# Patient Record
Sex: Female | Born: 1950 | Race: Black or African American | Hispanic: No | Marital: Married | Smoking: Former smoker
Health system: Southern US, Community
[De-identification: ages and names within clinical notes are randomized; demographics above are authoritative.]

## PROBLEM LIST (undated history)

## (undated) DIAGNOSIS — I509 Heart failure, unspecified: Secondary | ICD-10-CM

## (undated) DIAGNOSIS — I10 Essential (primary) hypertension: Secondary | ICD-10-CM

## (undated) DIAGNOSIS — J449 Chronic obstructive pulmonary disease, unspecified: Secondary | ICD-10-CM

## (undated) HISTORY — PX: ABDOMINAL HYSTERECTOMY: SHX81

---

## 1999-05-05 ENCOUNTER — Other Ambulatory Visit: Admission: RE | Admit: 1999-05-05 | Discharge: 1999-05-05 | Payer: Self-pay | Admitting: Family Medicine

## 2002-11-11 ENCOUNTER — Encounter: Admission: RE | Admit: 2002-11-11 | Discharge: 2002-11-11 | Payer: Self-pay | Admitting: Family Medicine

## 2002-11-11 ENCOUNTER — Encounter: Payer: Self-pay | Admitting: Family Medicine

## 2004-01-07 ENCOUNTER — Encounter: Admission: RE | Admit: 2004-01-07 | Discharge: 2004-01-07 | Payer: Self-pay | Admitting: Family Medicine

## 2017-10-11 ENCOUNTER — Emergency Department (HOSPITAL_BASED_OUTPATIENT_CLINIC_OR_DEPARTMENT_OTHER)
Admission: EM | Admit: 2017-10-11 | Discharge: 2017-10-11 | Disposition: A | Discharge status: Home / Self Care | Payer: Medicare HMO | Attending: Emergency Medicine | Admitting: Emergency Medicine

## 2017-10-11 ENCOUNTER — Emergency Department (HOSPITAL_BASED_OUTPATIENT_CLINIC_OR_DEPARTMENT_OTHER): Payer: Medicare HMO

## 2017-10-11 ENCOUNTER — Encounter (HOSPITAL_BASED_OUTPATIENT_CLINIC_OR_DEPARTMENT_OTHER): Payer: Self-pay | Admitting: *Deleted

## 2017-10-11 DIAGNOSIS — J441 Chronic obstructive pulmonary disease with (acute) exacerbation: Secondary | ICD-10-CM

## 2017-10-11 DIAGNOSIS — Z7982 Long term (current) use of aspirin: Secondary | ICD-10-CM | POA: Diagnosis not present

## 2017-10-11 DIAGNOSIS — R0602 Shortness of breath: Secondary | ICD-10-CM | POA: Diagnosis present

## 2017-10-11 DIAGNOSIS — Z79899 Other long term (current) drug therapy: Secondary | ICD-10-CM | POA: Insufficient documentation

## 2017-10-11 DIAGNOSIS — I509 Heart failure, unspecified: Secondary | ICD-10-CM | POA: Diagnosis not present

## 2017-10-11 DIAGNOSIS — R05 Cough: Secondary | ICD-10-CM | POA: Diagnosis not present

## 2017-10-11 DIAGNOSIS — R0981 Nasal congestion: Secondary | ICD-10-CM | POA: Diagnosis not present

## 2017-10-11 DIAGNOSIS — I110 Hypertensive heart disease with heart failure: Secondary | ICD-10-CM | POA: Diagnosis not present

## 2017-10-11 DIAGNOSIS — R062 Wheezing: Secondary | ICD-10-CM | POA: Diagnosis not present

## 2017-10-11 DIAGNOSIS — Z87891 Personal history of nicotine dependence: Secondary | ICD-10-CM | POA: Insufficient documentation

## 2017-10-11 HISTORY — DX: Chronic obstructive pulmonary disease, unspecified: J44.9

## 2017-10-11 HISTORY — DX: Essential (primary) hypertension: I10

## 2017-10-11 HISTORY — DX: Heart failure, unspecified: I50.9

## 2017-10-11 MED ORDER — PREDNISONE 50 MG PO TABS: 60.0000 mg | ORAL_TABLET | Freq: Once | ORAL | Status: DC

## 2017-10-11 MED ORDER — IPRATROPIUM-ALBUTEROL 0.5-2.5 (3) MG/3ML IN SOLN
3.0000 mL | Freq: Once | RESPIRATORY_TRACT | Status: AC
  Administered 2017-10-11: 3 mL via RESPIRATORY_TRACT

## 2017-10-11 MED ORDER — IPRATROPIUM-ALBUTEROL 0.5-2.5 (3) MG/3ML IN SOLN
3.0000 mL | Freq: Four times a day (QID) | RESPIRATORY_TRACT | Status: DC
Start: 2017-10-11 — End: 2017-10-12
  Administered 2017-10-11: 3 mL via RESPIRATORY_TRACT

## 2017-10-11 MED ORDER — PREDNISONE 20 MG PO TABS: 40.0000 mg | ORAL_TABLET | Freq: Every day | ORAL | 0 refills | Status: AC

## 2017-10-11 MED ORDER — ALBUTEROL SULFATE (2.5 MG/3ML) 0.083% IN NEBU
2.5000 mg | INHALATION_SOLUTION | Freq: Once | RESPIRATORY_TRACT | Status: AC
Start: 2017-10-11 — End: 2017-10-11
  Administered 2017-10-11: 2.5 mg via RESPIRATORY_TRACT

## 2017-10-11 MED ORDER — PREDNISONE 50 MG PO TABS
ORAL_TABLET | ORAL | Status: AC
  Administered 2017-10-11: 50 mg
  Filled 2017-10-11: qty 1

## 2017-10-11 MED ORDER — PREDNISONE 10 MG PO TABS
ORAL_TABLET | ORAL | Status: AC
  Administered 2017-10-11: 10 mg
  Filled 2017-10-11: qty 1

## 2017-10-11 NOTE — ED Triage Notes (Signed)
Pt c/o SOB  Wheezing , coughing  x 1 week HX copd

## 2017-10-11 NOTE — ED Provider Notes (Signed)
[facility] EMERGENCY DEPARTMENT Provider Note   CSN: [id number] Arrival date & time: 10/11/17  1745     History   Chief Complaint Chief Complaint  Patient presents with  . Shortness of Breath    HPI Terri Decker is a 67 y.o. female.  HPI  67 year old female with a history of CHF and COPD presents with shortness of breath and cough and wheezing.  Started about a week ago.  She is traveling here from [place] and will be going back around January 20.  This is typical of her COPD exacerbations.  She has been trying her albuterol with partial relief but then the symptoms return.  Cough is nonproductive and she has not had fevers.  Occasional chest pain but she states this is typical of her COPD.  She was given a breathing treatment prior to me seeing her and states she feels better.  Still has some wheezing.  Has had some nasal congestion.  Past Medical History:  Diagnosis Date  . CHF (congestive heart failure) (HCC)   . COPD (chronic obstructive pulmonary disease) (HCC)   . Hypertension     There are no active problems to display for this patient.   Past Surgical History:  Procedure Laterality Date  . ABDOMINAL HYSTERECTOMY      OB History    No data available       Home Medications    Prior to Admission medications   Medication Sig Start Date End Date Taking? Authorizing Provider  albuterol (PROVENTIL HFA;VENTOLIN HFA) 108 (90 Base) MCG/ACT inhaler Inhale 2 puffs into the lungs every 6 (six) hours as needed for wheezing or shortness of breath.   Yes [provider]  amLODipine (NORVASC) 10 MG tablet Take 10 mg by mouth daily.   Yes [provider]  aspirin 81 MG chewable tablet Chew by mouth daily.   Yes [provider]  budesonide-formoterol (SYMBICORT) 160-4.5 MCG/ACT inhaler Inhale 2 puffs into the lungs 2 (two) times daily.   Yes [provider]  calcium gluconate 500 MG tablet Take 1 tablet by mouth daily.   Yes  [provider]  cetirizine (ZYRTEC) 10 MG chewable tablet Chew 10 mg by mouth daily.   Yes [provider]  citalopram (CELEXA) 20 MG tablet Take 20 mg by mouth daily.   Yes [provider]  cloNIDine (CATAPRES) 0.1 MG tablet Take 0.1 mg by mouth 2 (two) times daily.   Yes [provider]  co-enzyme Q-10 30 MG capsule Take 100 mg by mouth daily.   Yes [provider]  furosemide (LASIX) 20 MG tablet Take 20 mg by mouth.   Yes [provider]  gabapentin (NEURONTIN) 600 MG tablet Take 600 mg by mouth 3 (three) times daily.   Yes [provider]  hydrOXYzine (ATARAX/VISTARIL) 25 MG tablet Take 25 mg by mouth every 6 (six) hours as needed.   Yes [provider]  meloxicam (MOBIC) 15 MG tablet Take 15 mg by mouth daily.   Yes [provider]  montelukast (SINGULAIR) 10 MG tablet Take 10 mg by mouth at bedtime.   Yes [provider]  pantoprazole (PROTONIX) 40 MG tablet Take 40 mg by mouth daily.   Yes [provider]  potassium chloride (K-DUR) 10 MEQ tablet Take 10 mEq by mouth daily.   Yes [provider]  tiotropium (SPIRIVA) 18 MCG inhalation capsule Place 18 mcg into inhaler and inhale daily.   Yes [provider]  tiZANidine (ZANAFLEX) 4 MG tablet Take 4 mg by mouth every 6 (six) hours as needed for muscle spasms.   Yes [provider]  valsartan-hydrochlorothiazide (DIOVAN-HCT) 320-25 MG tablet Take 1 tablet by mouth daily.   Yes [provider]  vitamin B-12 (CYANOCOBALAMIN) 250 MCG tablet Take 2,500 mcg by mouth daily.   Yes [provider]  predniSONE (DELTASONE) 20 MG tablet Take 2 tablets (40 mg total) by mouth daily. 2 tabs po daily x 4 days 10/12/17   Pricilla Loveless, MD    Family History No family history on file.  Social History Social History   Tobacco Use  . Smoking status: Former Smoker  Substance Use Topics  . Alcohol use: No     Frequency: Never  . Drug use: No     Allergies   Patient has no allergy information on record.   Review of Systems Review of Systems  Constitutional: Negative for fever.  Respiratory: Positive for cough, chest tightness and shortness of breath.   Cardiovascular: Positive for leg swelling. Palpitations: trace, ankles, bilateral.  All other systems reviewed and are negative.    Physical Exam Updated Vital Signs BP 138/78 (BP Location: Right Arm)   Pulse 83   Temp (!) 97.4 F (36.3 C) (Oral)   Resp 17   SpO2 98%   Physical Exam  Constitutional: She is oriented to person, place, and time. She appears well-developed and well-nourished.  Non-toxic appearance. She does not appear ill. No distress.  HENT:  Head: Normocephalic and atraumatic.  Right Ear: External ear normal.  Left Ear: External ear normal.  Nose: Nose normal.  Eyes: Right eye exhibits no discharge. Left eye exhibits no discharge.  Cardiovascular: Normal rate, regular rhythm and normal heart sounds.  Pulmonary/Chest: Effort normal. No accessory muscle usage. No tachypnea. No respiratory distress. She has wheezes (diffuse, expiratory).  Speaks in complete sentences without difficulty  Abdominal: Soft. There is no tenderness.  Musculoskeletal:       Right lower leg: She exhibits edema (trace, ankles).       Left lower leg: She exhibits edema (trace, ankles).  Neurological: She is alert and oriented to person, place, and time.  Skin: Skin is warm and dry.  Nursing note and vitals reviewed.    ED Treatments / Results  Labs (all labs ordered are listed, but only abnormal results are displayed) Labs Reviewed - No data to display  EKG  EKG Interpretation  Date/Time:  Wednesday October 11 2017 18:23:28 EST Ventricular Rate:  75 PR Interval:    QRS Duration: 99 QT Interval:  532 QTC Calculation: 595 R Axis:   63 Text Interpretation:  Sinus rhythm Borderline T abnormalities, anterior leads Prolonged QT  interval Baseline wander in lead(s) aVL No old tracing to compare Confirmed by Pricilla Loveless [phone]) on 10/11/2017 6:55:45 PM       Radiology Dg Chest 2 View  Result Date: 10/11/2017 CLINICAL DATA:  Wheezing and coughing. EXAM: CHEST  2 VIEW COMPARISON:  None. FINDINGS: Cardiomediastinal silhouette is normal. Mediastinal contours appear intact. Calcific atherosclerotic disease and tortuosity of the aorta. There is no evidence of focal airspace consolidation, pleural effusion or pneumothorax. Mild peribronchial thickening with central predominance. Osseous structures are without acute abnormality. Soft tissues are grossly normal. IMPRESSION: Mild peribronchial thickening with central predominance without evidence of lobar consolidation. Findings may be seen with acute bronchitis or reactive airway disease. Electronically Signed   By: Ted Mcalpine M.D.   On: 10/11/2017 19:46  Procedures Procedures (including critical care time)  Medications Ordered in ED Medications  ipratropium-albuterol (DUONEB) 0.5-2.5 (3) MG/3ML nebulizer solution 3 mL (3 mLs Nebulization Given 10/11/17 1800)  albuterol (PROVENTIL) (2.5 MG/3ML) 0.083% nebulizer solution 2.5 mg (2.5 mg Nebulization Given 10/11/17 1800)  predniSONE (DELTASONE) 50 MG tablet (50 mg  Given 10/11/17 1924)  predniSONE (DELTASONE) 10 MG tablet (10 mg  Given 10/11/17 1924)     Initial Impression / Assessment and Plan / ED Course  I have reviewed the triage vital signs and the nursing notes.  Pertinent labs & imaging results that were available during my care of the patient were reviewed by me and considered in my medical decision making (see chart for details).     Patient feels better after 2 albuterol treatments.  Presentation seems consistent with COPD exacerbation.  She does not have any signs of pulmonary edema on chest x-ray.  She does not have any productive sputum or fever to suggest pneumonia.  At this point, will treat with  prednisone and continued albuterol at home.  I do not think antibiotics are needed.  She is not hypoxic.  Discharge home with return precautions, follow-up with PCP when back in her home state.  Final Clinical Impressions(s) / ED Diagnoses   Final diagnoses:  COPD exacerbation [facility])    ED Discharge Orders        Ordered    predniSONE (DELTASONE) 20 MG tablet  Daily     10/11/17 2112       Pricilla Loveless, MD 10/12/17 [phone]

## 2017-10-11 NOTE — ED Notes (Signed)
RT in triage, pt taken to room 5

## 2017-10-11 NOTE — ED Notes (Signed)
Nurse first- O2 sat 98% HR 73 RR 24-NAD-states she has been wheezing x 3 days

## 2018-12-31 IMAGING — CR DG CHEST 2V
2 series · 2 of 2 positions shown · non-contrast
Comparison: None.

CLINICAL DATA: Wheezing and coughing.

EXAM:
CHEST  2 VIEW

[w chest pa]
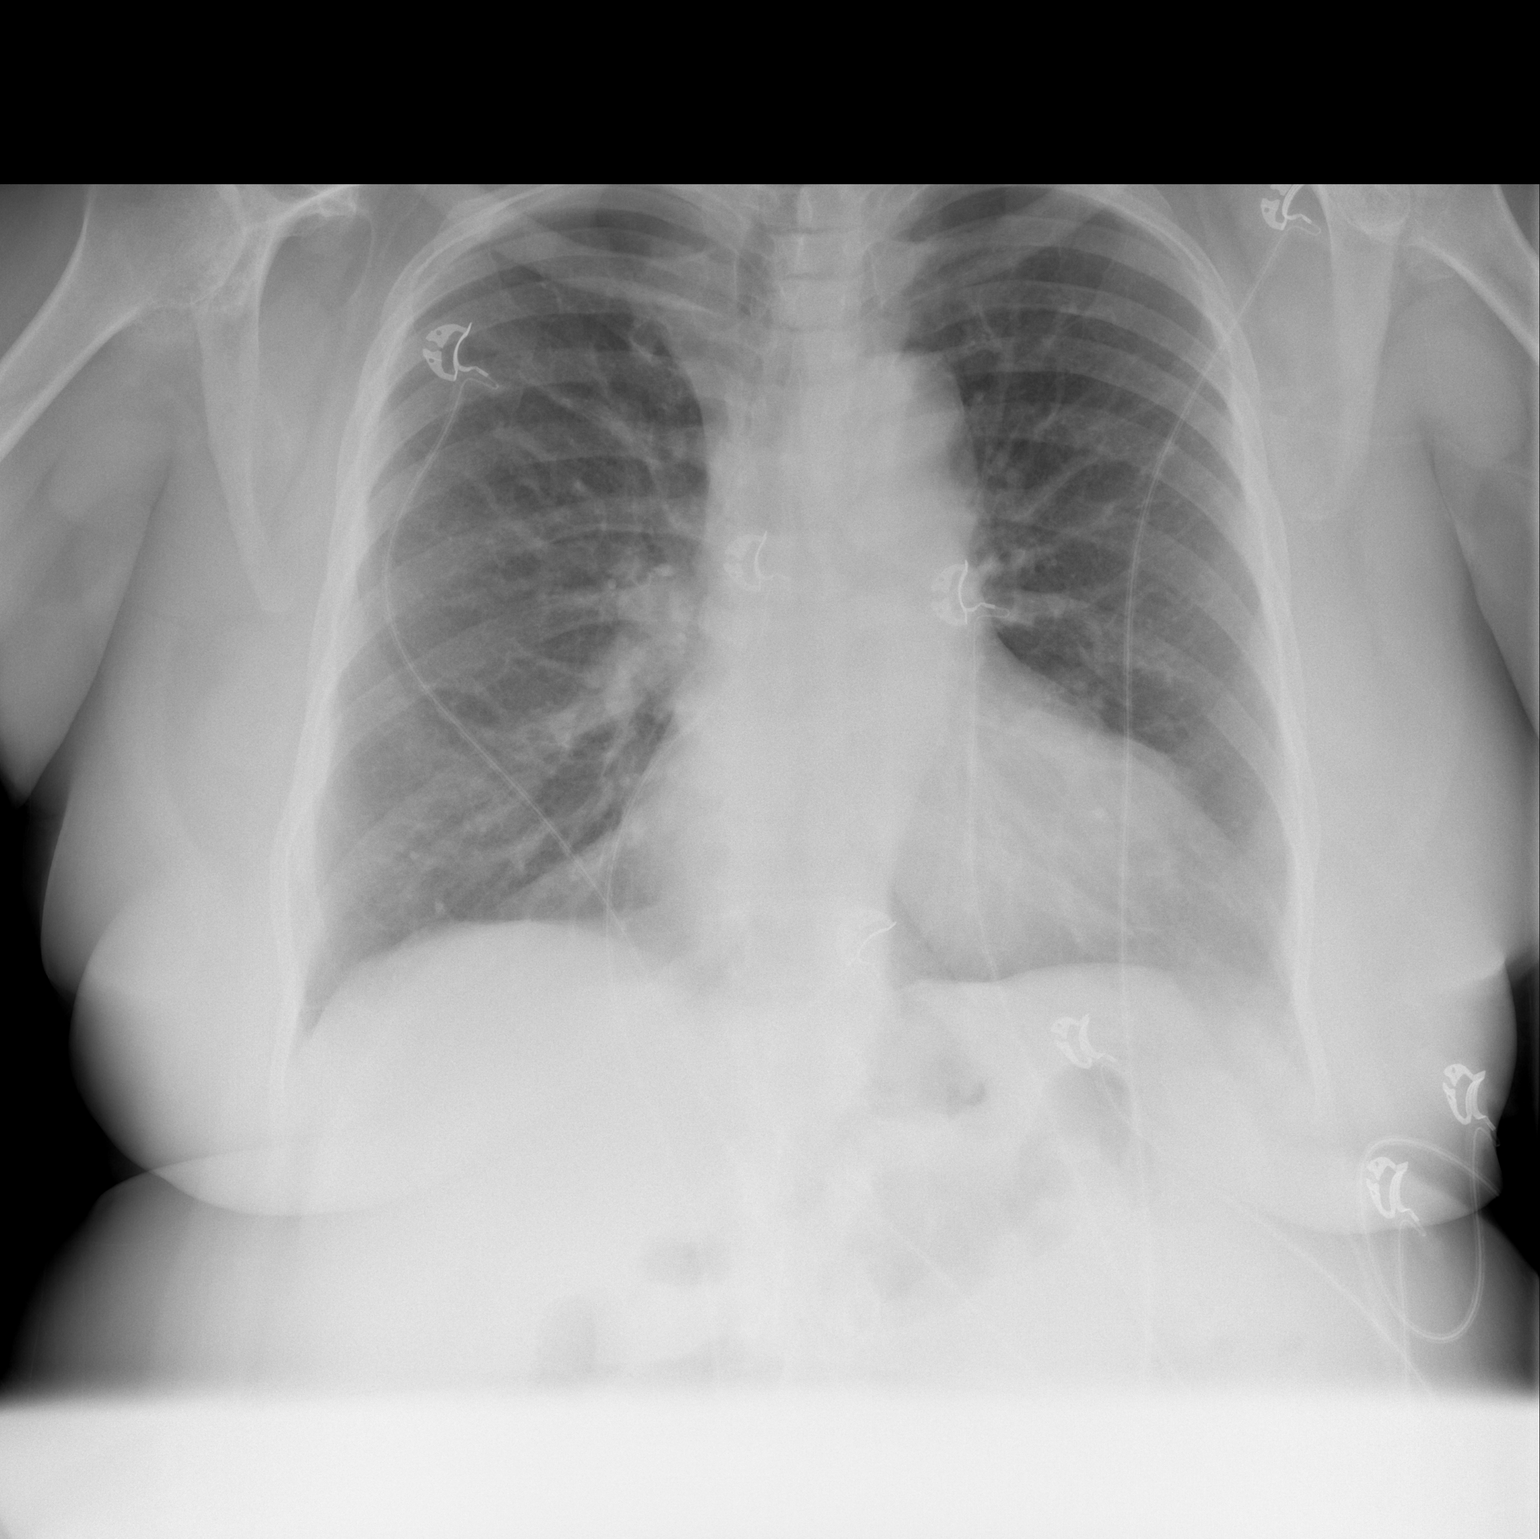

[w chest lat]
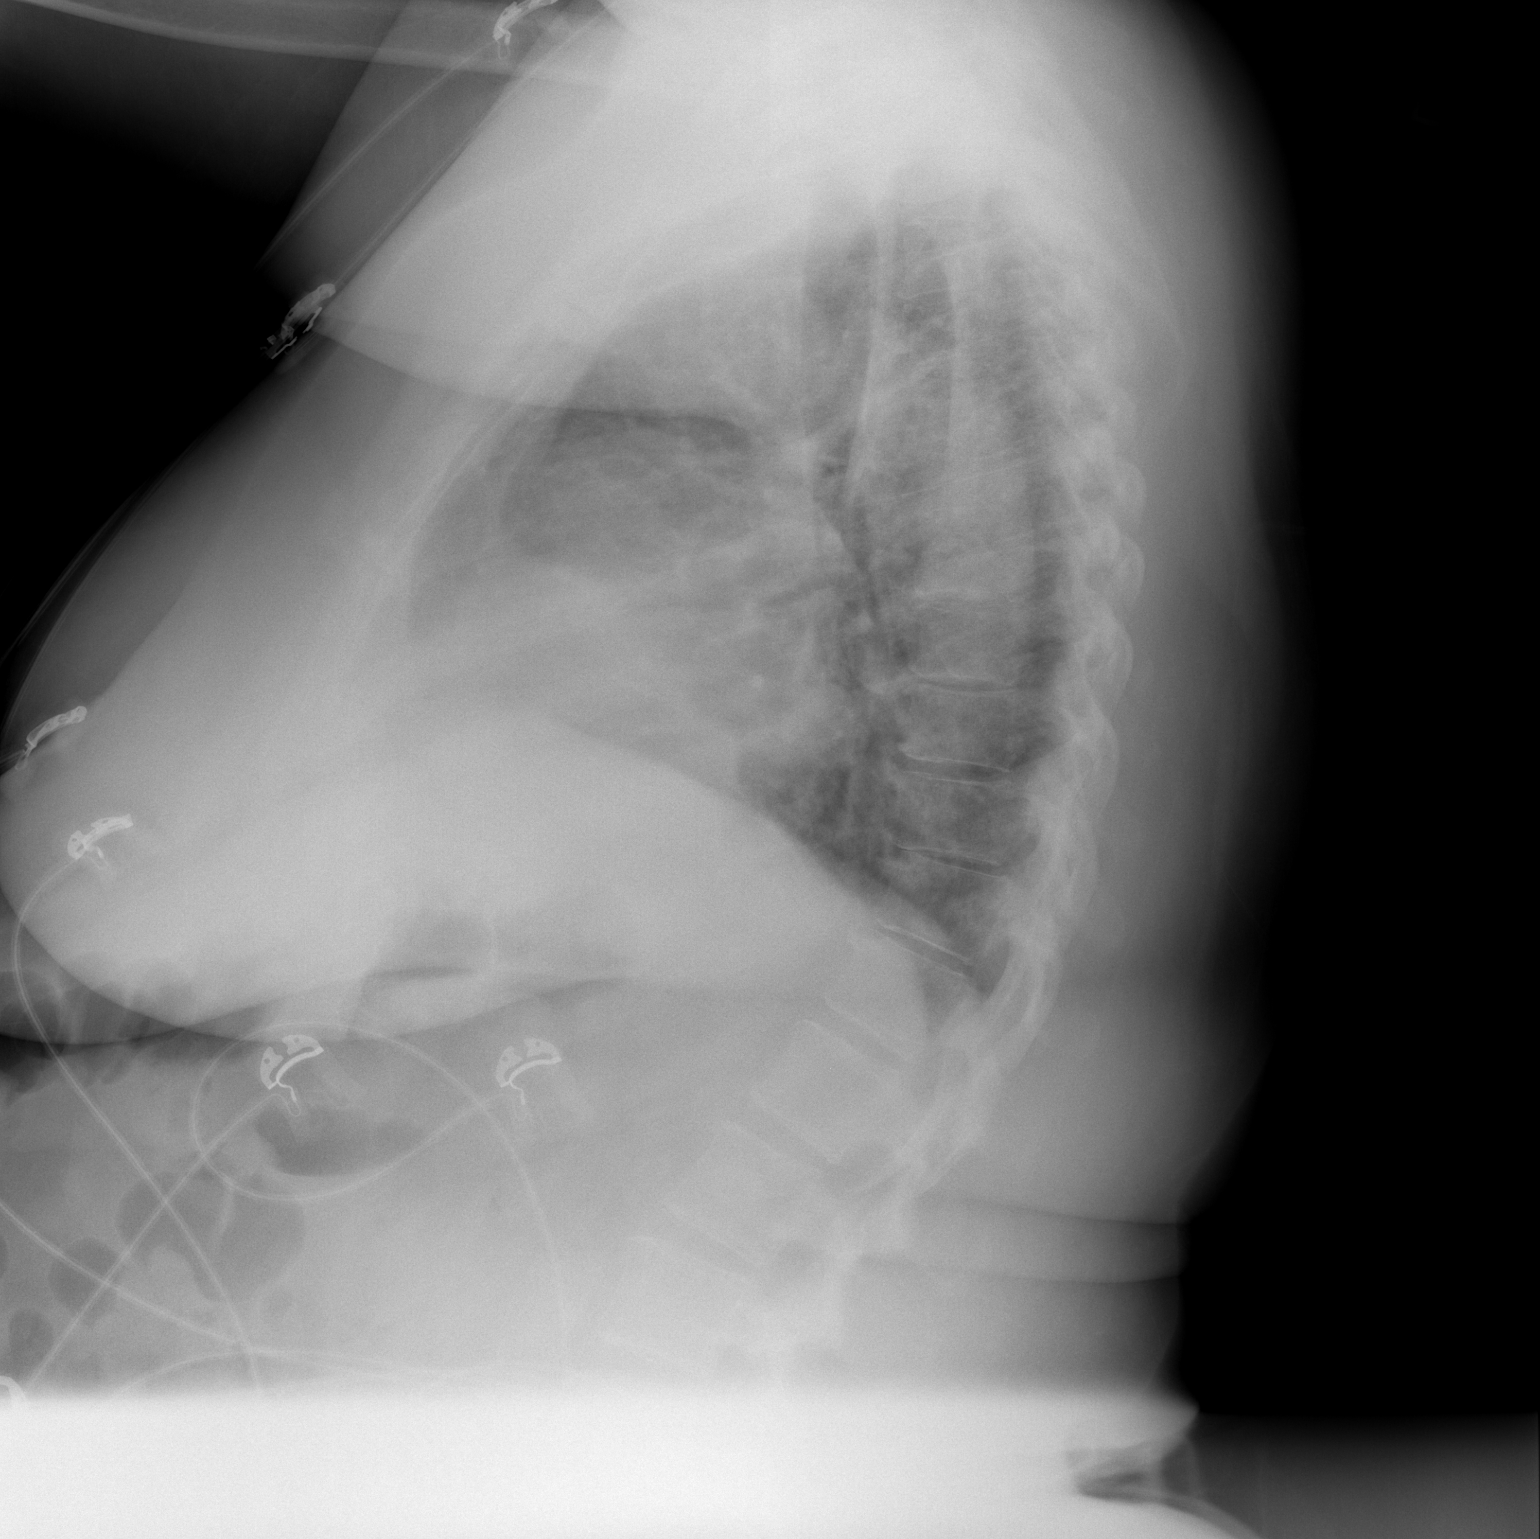

[2 of 2 positions shown; findings below may reference images not displayed]

FINDINGS: Cardiomediastinal silhouette is normal. Mediastinal contours appear
intact. Calcific atherosclerotic disease and tortuosity of the
aorta.

There is no evidence of focal airspace consolidation, pleural
effusion or pneumothorax. Mild peribronchial thickening with central
predominance.

Osseous structures are without acute abnormality. Soft tissues are
grossly normal.
IMPRESSION: Mild peribronchial thickening with central predominance without
evidence of lobar consolidation. Findings may be seen with acute
bronchitis or reactive airway disease.
